# Patient Record
Sex: Male | Born: 1965 | Race: Black or African American | Hispanic: No | Marital: Married | Smoking: Current every day smoker
Health system: Southern US, Community
[De-identification: ages and names within clinical notes are randomized; demographics above are authoritative.]

---

## 2011-08-03 ENCOUNTER — Emergency Department (HOSPITAL_BASED_OUTPATIENT_CLINIC_OR_DEPARTMENT_OTHER)
Admission: EM | Admit: 2011-08-03 | Discharge: 2011-08-03 | Disposition: A | Discharge status: Home / Self Care | Payer: Managed Care, Other (non HMO) | Attending: Emergency Medicine | Admitting: Emergency Medicine

## 2011-08-03 DIAGNOSIS — F17200 Nicotine dependence, unspecified, uncomplicated: Secondary | ICD-10-CM | POA: Insufficient documentation

## 2011-08-03 DIAGNOSIS — S46919A Strain of unspecified muscle, fascia and tendon at shoulder and upper arm level, unspecified arm, initial encounter: Secondary | ICD-10-CM

## 2011-08-03 DIAGNOSIS — IMO0002 Reserved for concepts with insufficient information to code with codable children: Secondary | ICD-10-CM | POA: Insufficient documentation

## 2011-08-03 DIAGNOSIS — Y9289 Other specified places as the place of occurrence of the external cause: Secondary | ICD-10-CM | POA: Insufficient documentation

## 2011-08-03 DIAGNOSIS — X503XXA Overexertion from repetitive movements, initial encounter: Secondary | ICD-10-CM | POA: Insufficient documentation

## 2011-08-03 MED ORDER — CYCLOBENZAPRINE HCL 10 MG PO TABS: 10.0000 mg | ORAL_TABLET | Freq: Two times a day (BID) | ORAL | Status: AC | PRN

## 2011-08-03 MED ORDER — NAPROXEN 500 MG PO TABS: 500.0000 mg | ORAL_TABLET | Freq: Two times a day (BID) | ORAL | Status: AC

## 2011-08-03 MED ORDER — IBUPROFEN 800 MG PO TABS
800.0000 mg | ORAL_TABLET | Freq: Once | ORAL | Status: AC
  Administered 2011-08-03: 800 mg via ORAL
  Filled 2011-08-03: qty 1

## 2011-08-03 NOTE — ED Notes (Signed)
States intermitted decreased ROM in left shoulder; good cap refill; good pulses

## 2011-08-03 NOTE — ED Notes (Signed)
Neurovascular status intact after sling applied

## 2011-08-03 NOTE — ED Provider Notes (Signed)
History     CSN: [id number] Arrival date & time: 08/03/2011  8:41 AM  Chief Complaint  Patient presents with  . 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 He has had pain to the posterior left shoulder which has been present for the past 6-8 months. It is worse with movement palpation. He has not tried any home remedies other than lying down. There is no weakness, numbness, tingling. There are times when the pain intermittently shoots down his arm. There is no chest pain, shortness of breath, abdominal pain. The pain is only exacerbated by movement of the shoulder. There is no hx of trauma  Patient is a 45 y.o. male presenting with shoulder pain. The history is provided by the patient. No language interpreter was used.  Shoulder Pain This is a chronic problem. The current episode started more than 1 week ago (6-8 months). The problem occurs constantly. The problem has not changed since onset.Pertinent negatives include no chest pain, no abdominal pain, no headaches and no shortness of breath. The symptoms are aggravated by bending. The symptoms are relieved by relaxation, position and lying down. He has tried rest for the symptoms. The treatment provided mild relief.    History reviewed. No pertinent past medical history.  History reviewed. No pertinent past surgical history.  No family history on file.  History  Substance Use Topics  . Smoking status: Current Everyday Smoker  . Smokeless tobacco: Not on file  . Alcohol Use: 7.2 oz/week    12 Cans of beer per week     weekends onl.y      Review of Systems  Constitutional: Negative for fever, chills and activity change.  HENT: Negative for congestion, sore throat, rhinorrhea, neck pain and neck stiffness.   Respiratory: Negative for cough, chest tightness and shortness of breath.   Cardiovascular: Negative for chest pain and palpitations.    Gastrointestinal: Negative for nausea, vomiting and abdominal pain.  Genitourinary: Negative for dysuria, urgency and frequency.  Musculoskeletal: Negative for back pain.  Neurological: Negative for dizziness, weakness, numbness and headaches.  All other systems reviewed and are negative.    Physical Exam  BP 143/104  Pulse 72  Temp(Src) 98.4 F (36.9 C) (Oral)  Resp 20  SpO2 100%  Physical Exam  Constitutional: He is oriented to person, place, and time. He appears well-developed and well-nourished. No distress.  HENT:  Head: Normocephalic and atraumatic.  Mouth/Throat: Oropharynx is clear and moist.  Eyes: Conjunctivae and EOM are normal. Pupils are equal, round, and reactive to light.  Neck: Normal range of motion. Neck supple.       Negative sperlings test bilaterally.  Cardiovascular: Normal rate, regular rhythm and normal heart sounds.   No murmur heard. Pulmonary/Chest: Effort normal and breath sounds normal. No respiratory distress.  Abdominal: Soft. Bowel sounds are normal. There is no tenderness.  Musculoskeletal:       Left shoulder: He exhibits tenderness, pain and spasm (posterior musculature). He exhibits no bony tenderness, no swelling, no effusion and no deformity.       Full rom of L shoulder in all cardinal movements with mild pain.  Normal strength on internal and external rotation and abduction  Neurological: He is alert and oriented to person, place, and time.    ED Course  Procedures  MDM 1. L shoulder strain  Given the chronic nature of the patient's pain and his  occupation I feel this is secondary to a shoulder strain. I believe the repetitive nature of his work continues to exacerbate the issue. He has no neurologic symptoms. No history of trauma. I do not feel this is cardiac as he lacks chest pain and shortness of breath and has been present constantly for 6-8 months. I will provide a sling and administer anti-inflammatory medication as well as a  muscle relaxant. I will give him rest of the week off of work. He is instructed to followup as needed. I explained it is important for him to apply ice and heat and to maintain range of motion of the shoulder to prevent frozen shoulder syndrome.      Dayton Bailiff, MD 08/03/11 0900

## 2017-11-12 ENCOUNTER — Other Ambulatory Visit: Payer: Self-pay | Admitting: Occupational Medicine

## 2017-11-12 ENCOUNTER — Ambulatory Visit: Payer: Managed Care, Other (non HMO)

## 2017-11-12 DIAGNOSIS — Z0000 Encounter for general adult medical examination without abnormal findings: Secondary | ICD-10-CM

## 2018-05-30 IMAGING — DX DG CHEST 1V
1 series · 1 of 1 positions shown · non-contrast
Comparison: None.

CLINICAL DATA: Physical examination.  Current smoker.

EXAM:
CHEST 1 VIEW

[chest pa]
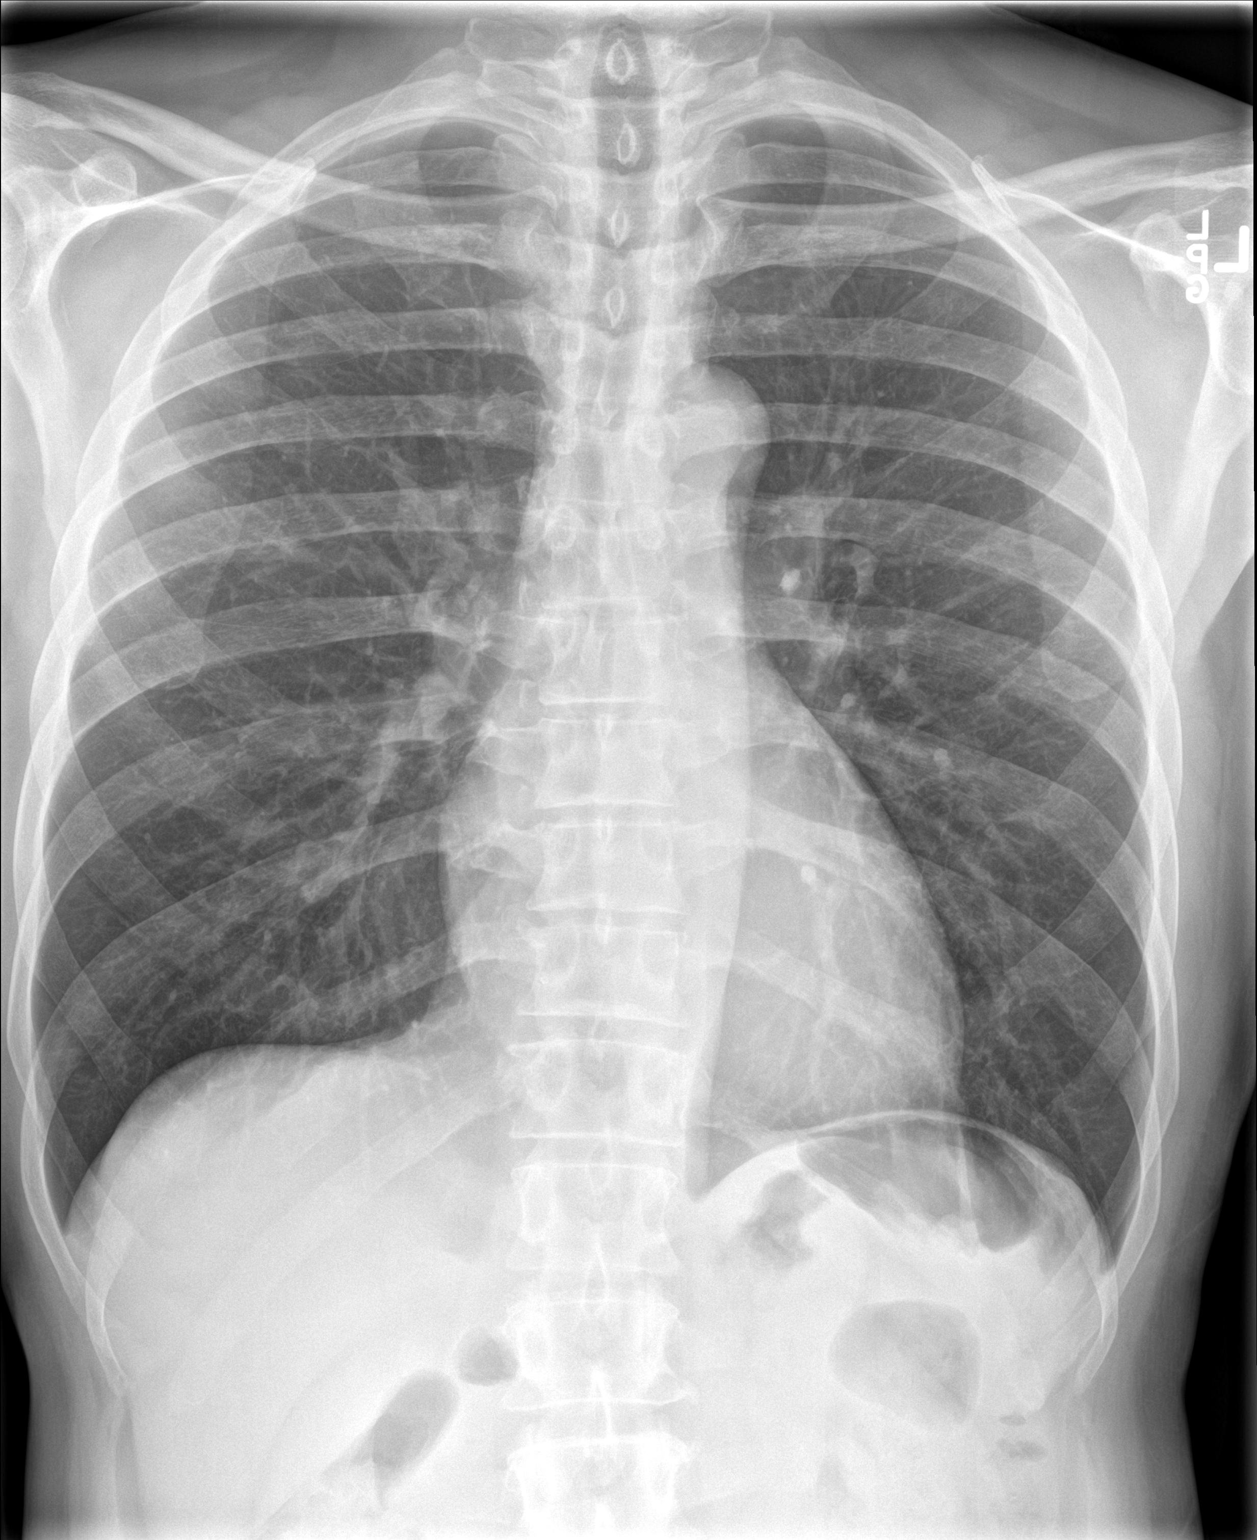

[1 of 1 positions shown; findings below may reference images not displayed]

FINDINGS: The cardiac silhouette, mediastinal and hilar contours are within
normal limits. The lungs demonstrate mild hyperinflation and mild
attenuation of pulmonary vasculature suggesting emphysematous
changes. No infiltrates, edema or effusions. The bony thorax is
intact.
IMPRESSION: Early emphysematous changes but no acute pulmonary findings.

## 2021-11-01 ENCOUNTER — Emergency Department (HOSPITAL_BASED_OUTPATIENT_CLINIC_OR_DEPARTMENT_OTHER)
Admission: EM | Admit: 2021-11-01 | Discharge: 2021-11-01 | Disposition: A | Discharge status: Home / Self Care | Payer: BC Managed Care – PPO | Attending: Emergency Medicine | Admitting: Emergency Medicine

## 2021-11-01 ENCOUNTER — Other Ambulatory Visit: Payer: Self-pay

## 2021-11-01 ENCOUNTER — Encounter (HOSPITAL_BASED_OUTPATIENT_CLINIC_OR_DEPARTMENT_OTHER): Payer: Self-pay | Admitting: Emergency Medicine

## 2021-11-01 DIAGNOSIS — F17200 Nicotine dependence, unspecified, uncomplicated: Secondary | ICD-10-CM | POA: Insufficient documentation

## 2021-11-01 DIAGNOSIS — R059 Cough, unspecified: Secondary | ICD-10-CM | POA: Diagnosis not present

## 2021-11-01 DIAGNOSIS — R0981 Nasal congestion: Secondary | ICD-10-CM | POA: Insufficient documentation

## 2021-11-01 DIAGNOSIS — R509 Fever, unspecified: Secondary | ICD-10-CM | POA: Diagnosis not present

## 2021-11-01 DIAGNOSIS — M791 Myalgia, unspecified site: Secondary | ICD-10-CM | POA: Diagnosis present

## 2021-11-01 DIAGNOSIS — J111 Influenza due to unidentified influenza virus with other respiratory manifestations: Secondary | ICD-10-CM

## 2021-11-01 NOTE — ED Triage Notes (Signed)
Pt c/o body aches, facial pain, fever, cough. Pt states he was around his grandchild who had the flu

## 2021-11-01 NOTE — ED Provider Notes (Signed)
[facility] EMERGENCY DEPARTMENT Provider Note   CSN: [id number] Arrival date & time: 11/01/21  7628     History Chief Complaint  Patient presents with   Generalized Body Aches    Garrett Martin is a 55 y.o. male.  The history is provided by the patient.  Cough Cough characteristics:  Non-productive and productive Sputum characteristics:  Yellow Severity:  Moderate Onset quality:  Gradual Duration:  2 days Timing:  Intermittent Progression:  Worsening Chronicity:  New Smoker: yes   Relieved by:  Nothing Worsened by:  Nothing Associated symptoms: chills, fever, myalgias and sinus congestion   Associated symptoms: no chest pain and no shortness of breath   Patient reports he was exposed to a family member with the flu.  Over the past 2 days he has had flulike symptoms including chills, fever, cough.  He also reports facial pain and sinus congestion.  No chest pain or shortness of breath.     PMH-none Social History   Tobacco Use   Smoking status: Every Day  Substance Use Topics   Alcohol use: Yes    Alcohol/week: 12.0 standard drinks    Types: 12 Cans of beer per week    Comment: weekends onl.y   Drug use: No    Home Medications Prior to Admission medications   Not on File    Allergies    Patient has no known allergies.  Review of Systems   Review of Systems  Constitutional:  Positive for chills and fever.  HENT:  Positive for sinus pain.   Respiratory:  Positive for cough. Negative for shortness of breath.   Cardiovascular:  Negative for chest pain.  Gastrointestinal:  Negative for vomiting.  Musculoskeletal:  Positive for myalgias.  All other systems reviewed and are negative.  Physical Exam Updated Vital Signs BP (!) 165/105   Pulse (!) 118   Temp 99.5 F (37.5 C) (Oral)   Resp 16   Ht 1.784 m (5' 10.25")   Wt 78 kg   SpO2 99%   BMI 24.50 kg/m   Physical Exam CONSTITUTIONAL: Well developed/well nourished HEAD:  Normocephalic/atraumatic EYES: EOMI ENMT: Mucous membranes moist, uvula midline no erythema exudate, no stridor No drooling No facial swelling is noted NECK: supple no meningeal signs SPINE/BACK:entire spine nontender CV: S1/S2 noted, no murmurs/rubs/gallops noted LUNGS: Lungs are clear to auscultation bilaterally, no apparent distress ABDOMEN: soft, nontender NEURO: Pt is awake/alert/appropriate, moves all extremitiesx4.  No facial droop.   EXTREMITIES: pulses normal/equal, full ROM SKIN: warm, color normal PSYCH: no abnormalities of mood noted, alert and oriented to situation  ED Results / Procedures / Treatments   Labs (all labs ordered are listed, but only abnormal results are displayed) Labs Reviewed - No data to display  EKG None  Radiology No results found.  Procedures Procedures   Medications Ordered in ED Medications - No data to display  ED Course  I have reviewed the triage vital signs and the nursing notes.    MDM Rules/Calculators/A&P                           Patient otherwise well-appearing.  Presents with flulike illness for 2 days. Given his appearance, he will be discharged home & discussed need for rest, fluids and monitoring symptoms.  Will defer any testing at this time.  Lung sounds are clear, no hypoxia.  Patient is mildly tachycardic but otherwise in no distress. Clinically does not have pneumonia  at this time We discussed return precautions  Final Clinical Impression(s) / ED Diagnoses Final diagnoses:  Influenza-like illness    Rx / DC Orders ED Discharge Orders     None        Zadie Rhine, MD 11/01/21 [phone]
